# Patient Record
Sex: Female | Born: 1950 | Race: White | Hispanic: No | Marital: Married
Health system: Southern US, Community
[De-identification: ages and names within clinical notes are randomized; demographics above are authoritative.]

---

## 2007-03-21 ENCOUNTER — Inpatient Hospital Stay: Payer: Self-pay | Admitting: Internal Medicine

## 2007-03-21 ENCOUNTER — Other Ambulatory Visit: Payer: Self-pay

## 2008-04-24 IMAGING — CT CT ANGIOGRAPHY NECK
1 of 4 series · 12 of 33 positions shown · IV contrast (APPLIED)
Comparison: none

REASON FOR EXAM: CVA with occluded external carotid noticed in carotid
doppler
COMMENTS:

[Series 4: soft tissue · axial · 0.46mm/px · z∈[+1041,+1299]mm · 12 of 102 slices shown]
[im 8/102  soft-tissue]
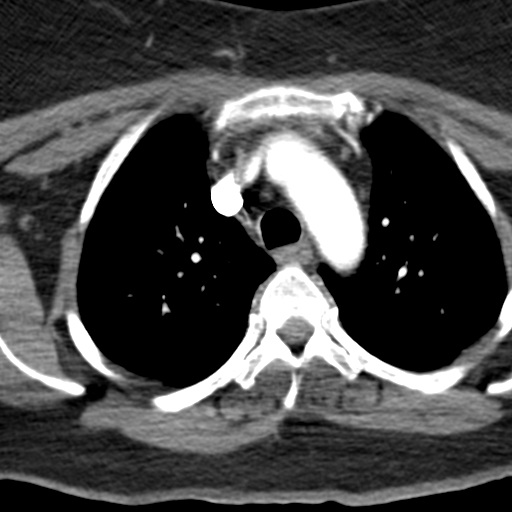
[im 16/102  bone]
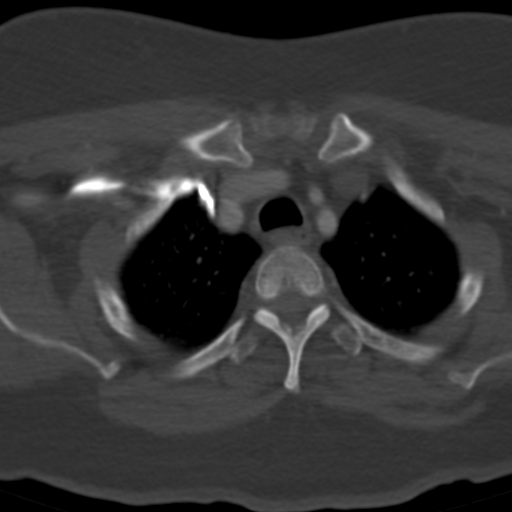
[im 24/102  soft-tissue]
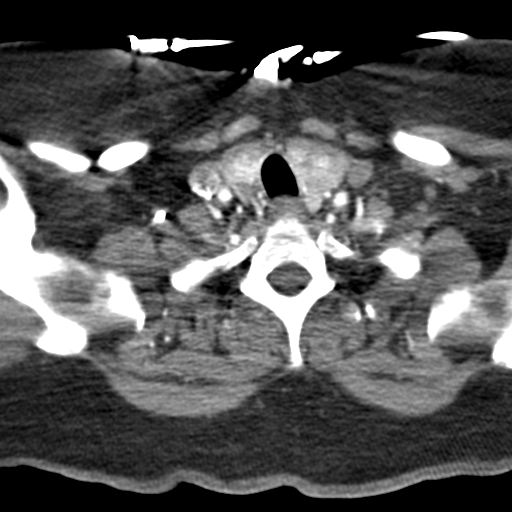
[im 32/102  bone]
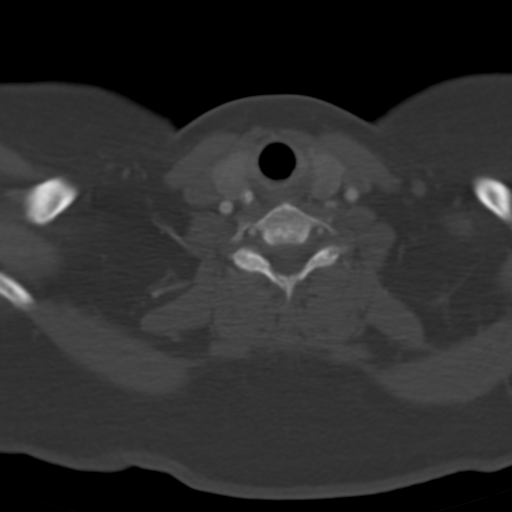
[im 39/102  soft-tissue]
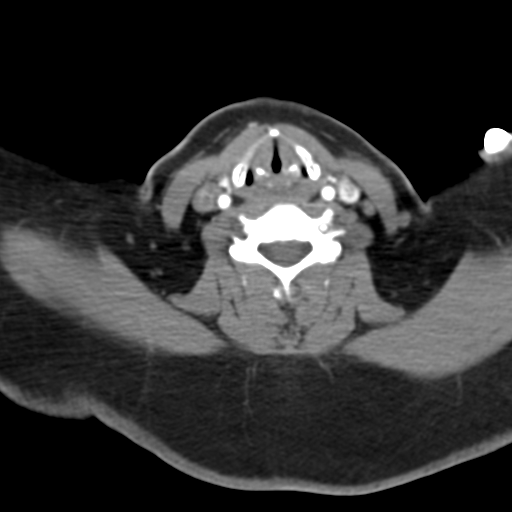
[im 47/102  bone]
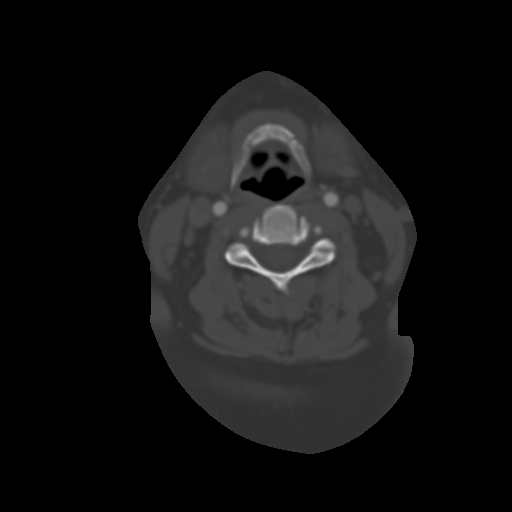
[im 55/102  soft-tissue]
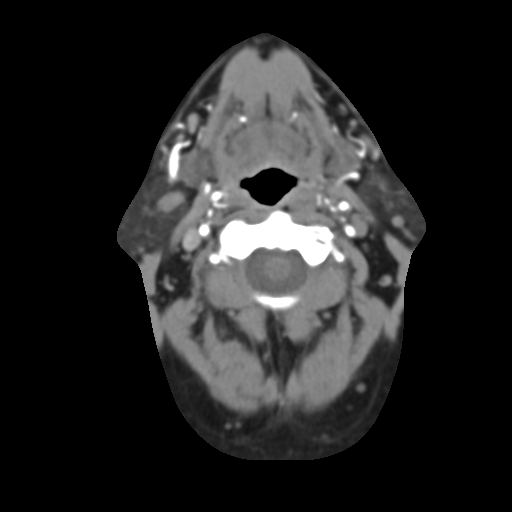
[im 63/102  bone]
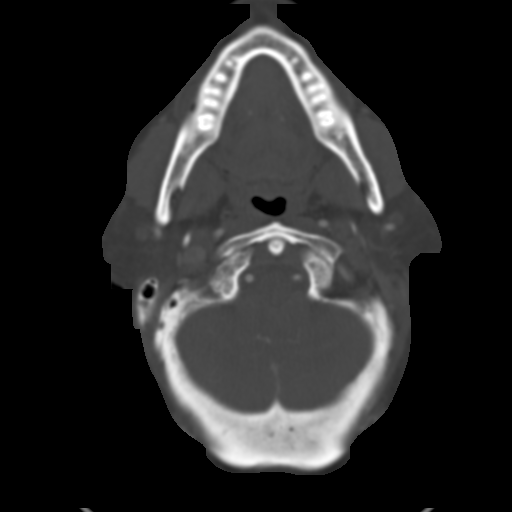
[im 70/102  soft-tissue]
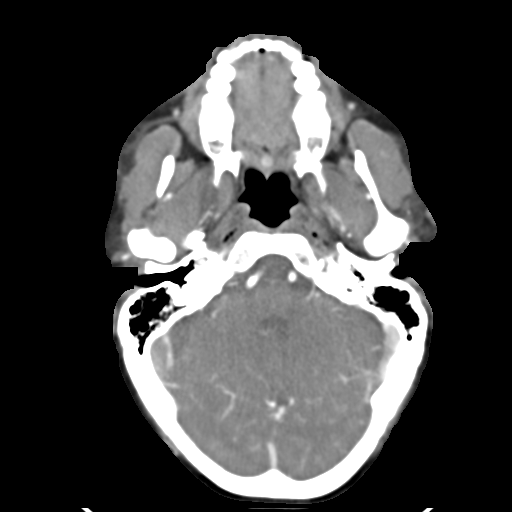
[im 78/102  bone]
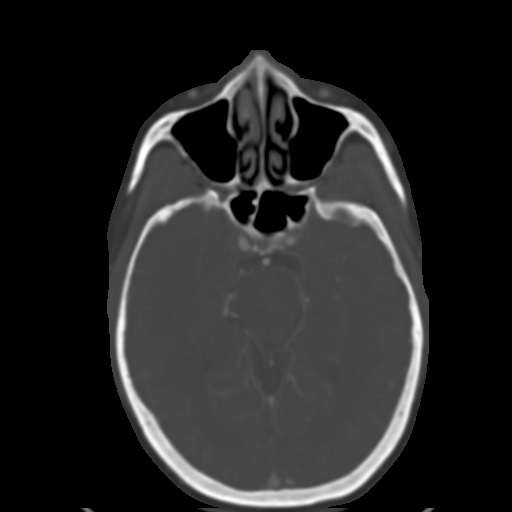
[im 86/102  soft-tissue]
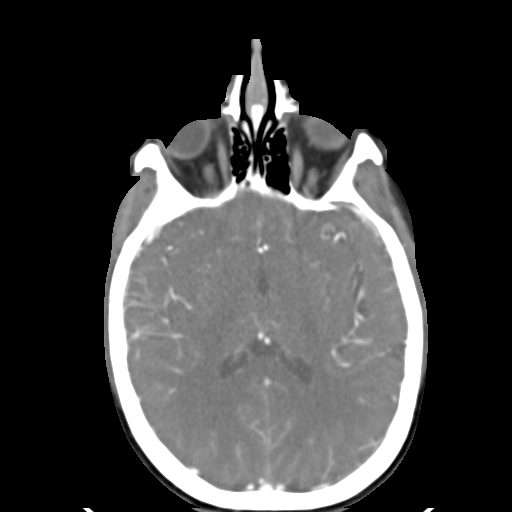
[im 94/102  bone]
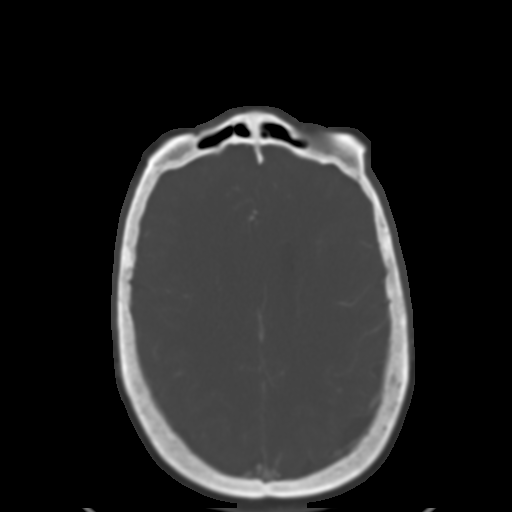

[12 of 33 positions shown; findings below may reference images not displayed]

PROCEDURE:     CT  - CT ANGIOGRAPHY NECK W/WO  - March 23, 2007  [DATE]

RESULT:     Axial source images as well as coronal and sagittal maximum
intensity projections and 3-D reconstructions were performed of the RIGHT
and LEFT carotid systems status post intravenous administration of 100 ml of
[id number].

Evaluation of the RIGHT and LEFT carotid systems demonstrates no evidence of
strictural narrowing, focal outpouchings, fusiform dilatation or segmental
narrowing. There is marked tortuosity of the RIGHT and LEFT internal carotid
arteries.
IMPRESSION: 1.No CT evidence of hemodynamically significant stenosis. The patient's
floor nurse was informed of these findings at the time of the initial
interpretation via a phone call on 03-23-07 at approximately [DATE]. A
preliminary faxed report was relayed to the floor on 03-23-07 at [DATE] [employer].

## 2019-05-07 ENCOUNTER — Other Ambulatory Visit: Payer: Self-pay

## 2019-05-07 ENCOUNTER — Ambulatory Visit: Payer: Self-pay

## 2019-05-07 ENCOUNTER — Ambulatory Visit: Payer: Medicare Other | Attending: Internal Medicine

## 2019-05-07 DIAGNOSIS — Z23 Encounter for immunization: Secondary | ICD-10-CM | POA: Insufficient documentation

## 2019-05-07 NOTE — Progress Notes (Signed)
   Covid-19 Vaccination [facility]  Name:  Elky Funches    MRN: [id number] DOB: [birth year]  05/07/2019  Ms. Offutt was observed post Covid-19 immunization for 15 minutes without incidence. She was provided with Vaccine Information Sheet and instruction to access the V-Safe system.   Ms. Pokorny was instructed to call 911 with any severe reactions post vaccine: Marland Kitchen Difficulty breathing  . Swelling of your face and throat  . A fast heartbeat  . A bad rash all over your body  . Dizziness and weakness    Immunizations Administered    Name Date Dose VIS Date Route   Pfizer COVID-19 Vaccine 05/07/2019  5:55 PM 0.3 mL 03/28/2019 Intramuscular   Manufacturer: [employer], [employer]   Lot: [id number]   NDC: [address]-3

## 2019-05-14 ENCOUNTER — Ambulatory Visit: Payer: Self-pay

## 2019-05-21 ENCOUNTER — Ambulatory Visit: Payer: Self-pay

## 2019-05-25 ENCOUNTER — Ambulatory Visit: Payer: Self-pay

## 2019-05-28 ENCOUNTER — Ambulatory Visit: Payer: Medicare Other | Attending: Internal Medicine

## 2019-05-28 ENCOUNTER — Ambulatory Visit: Payer: Self-pay

## 2019-05-28 DIAGNOSIS — Z23 Encounter for immunization: Secondary | ICD-10-CM

## 2019-05-28 NOTE — Progress Notes (Signed)
   Covid-19 Vaccination Clinic  Name:  Ashley Tran    MRN: [id number] DOB: 1950/06/10  05/28/2019  [name] was observed post Covid-19 immunization for 15 minutes without incidence. She was provided with Vaccine Information Sheet and instruction to access the V-Safe system.   [name] was instructed to call 911 with any severe reactions post vaccine: Marland Kitchen Difficulty breathing  . Swelling of your face and throat  . A fast heartbeat  . A bad rash all over your body  . Dizziness and weakness    Immunizations Administered    Name Date Dose VIS Date Route   Pfizer COVID-19 Vaccine 05/28/2019  8:10 AM 0.3 mL 03/28/2019 Intramuscular   Manufacturer: [employer], [employer]   Lot: [id number]   NDC: [address]-6
